# Patient Record
Sex: Female | Born: 1935 | Race: White | Hispanic: No | Smoking: Former smoker
Health system: Southern US, Community
[De-identification: ages and names within clinical notes are randomized; demographics above are authoritative.]

## PROBLEM LIST (undated history)

## (undated) DIAGNOSIS — I639 Cerebral infarction, unspecified: Secondary | ICD-10-CM

## (undated) DIAGNOSIS — I4891 Unspecified atrial fibrillation: Secondary | ICD-10-CM

## (undated) DIAGNOSIS — E079 Disorder of thyroid, unspecified: Secondary | ICD-10-CM

## (undated) DIAGNOSIS — M1990 Unspecified osteoarthritis, unspecified site: Secondary | ICD-10-CM

## (undated) DIAGNOSIS — F0390 Unspecified dementia without behavioral disturbance: Secondary | ICD-10-CM

## (undated) DIAGNOSIS — I10 Essential (primary) hypertension: Secondary | ICD-10-CM

## (undated) HISTORY — PX: ROTATOR CUFF REPAIR: SHX139

## (undated) HISTORY — PX: ABDOMINAL HYSTERECTOMY: SHX81

## (undated) HISTORY — PX: TONSILLECTOMY: SUR1361

## (undated) HISTORY — PX: BACK SURGERY: SHX140

## (undated) HISTORY — PX: APPENDECTOMY: SHX54

---

## 2016-10-04 ENCOUNTER — Emergency Department (HOSPITAL_COMMUNITY)
Admission: EM | Admit: 2016-10-04 | Discharge: 2016-10-04 | Disposition: A | Discharge status: Home / Self Care | Payer: Medicare Other | Attending: Emergency Medicine | Admitting: Emergency Medicine

## 2016-10-04 ENCOUNTER — Emergency Department (HOSPITAL_COMMUNITY): Payer: Medicare Other

## 2016-10-04 ENCOUNTER — Encounter (HOSPITAL_COMMUNITY): Payer: Self-pay | Admitting: Emergency Medicine

## 2016-10-04 DIAGNOSIS — F0390 Unspecified dementia without behavioral disturbance: Secondary | ICD-10-CM | POA: Diagnosis not present

## 2016-10-04 DIAGNOSIS — Z87891 Personal history of nicotine dependence: Secondary | ICD-10-CM | POA: Insufficient documentation

## 2016-10-04 DIAGNOSIS — I10 Essential (primary) hypertension: Secondary | ICD-10-CM | POA: Diagnosis not present

## 2016-10-04 DIAGNOSIS — M25511 Pain in right shoulder: Secondary | ICD-10-CM

## 2016-10-04 HISTORY — DX: Essential (primary) hypertension: I10

## 2016-10-04 HISTORY — DX: Cerebral infarction, unspecified: I63.9

## 2016-10-04 HISTORY — DX: Unspecified atrial fibrillation: I48.91

## 2016-10-04 HISTORY — DX: Disorder of thyroid, unspecified: E07.9

## 2016-10-04 HISTORY — DX: Unspecified dementia, unspecified severity, without behavioral disturbance, psychotic disturbance, mood disturbance, and anxiety: F03.90

## 2016-10-04 MED ORDER — HYDROCODONE-ACETAMINOPHEN 5-325 MG PO TABS
1.0000 | ORAL_TABLET | Freq: Once | ORAL | Status: AC
  Administered 2016-10-04: 1 via ORAL
  Filled 2016-10-04: qty 1

## 2016-10-04 MED ORDER — HYDROCODONE-ACETAMINOPHEN 5-325 MG PO TABS: 1.0000 | ORAL_TABLET | ORAL | 0 refills | Status: DC | PRN

## 2016-10-04 NOTE — ED Provider Notes (Signed)
[facility]-EMERGENCY DEPT Provider Note   CSN: [id number] Arrival date & time: 10/04/16  1708     History   Chief Complaint Chief Complaint  Patient presents with  . Shoulder Pain    HPI Diane Simon is a 83 y.o. female.  HPI Patient with chronic right shoulder pain for the past 4 years. States that the last week and a half pain is worsen. She's had bruising which is extended down the arm. Denies any known injury. Has been referred to an orthopedist by her primary physician. Says she's taking Ultram but pain has not improved. Denies any fever or chills. No numbness or weakness. Past Medical History:  Diagnosis Date  . Atrial fibrillation (HCC)   . Dementia   . Hypertension   . Stroke (HCC)   . Thyroid disease     There are no active problems to display for this patient.   Past Surgical History:  Procedure Laterality Date  . ABDOMINAL HYSTERECTOMY    . APPENDECTOMY    . BACK SURGERY    . ROTATOR CUFF REPAIR Right   . TONSILLECTOMY      OB History    No data available       Home Medications    Prior to Admission medications   Medication Sig Start Date End Date Taking? Authorizing Provider  HYDROcodone-acetaminophen (NORCO) 5-325 MG tablet Take 1-2 tablets by mouth every 4 (four) hours as needed. 10/04/16   Loren RacerYelverton, Evolet Salminen, MD    Family History No family history on file.  Social History Social History  Substance Use Topics  . Smoking status: Former Games developermoker  . Smokeless tobacco: Never Used  . Alcohol use No     Allergies   Ciprofloxacin; Latex; and Penicillins   Review of Systems Review of Systems  Constitutional: Negative for chills and fever.  Respiratory: Negative for cough and shortness of breath.   Cardiovascular: Negative for chest pain and leg swelling.  Gastrointestinal: Negative for abdominal pain, diarrhea, nausea and vomiting.  Musculoskeletal: Positive for arthralgias and myalgias. Negative for back pain and neck pain.  Skin: Positive  for color change.  Neurological: Negative for weakness and numbness.  All other systems reviewed and are negative.    Physical Exam Updated Vital Signs There were no vitals taken for this visit.  Physical Exam  Constitutional: She is oriented to person, place, and time. She appears well-developed and well-nourished.  HENT:  Head: Normocephalic and atraumatic.  Mouth/Throat: Oropharynx is clear and moist.  Eyes: Pupils are equal, round, and reactive to light. EOM are normal.  Neck: Normal range of motion. Neck supple.  No posterior midline cervical tenderness to palpation.  Cardiovascular: Normal rate and regular rhythm.   Pulmonary/Chest: Effort normal and breath sounds normal.  Abdominal: Soft. Bowel sounds are normal. There is no tenderness. There is no rebound and no guarding.  Musculoskeletal: She exhibits tenderness. She exhibits no edema or deformity.  Patient with diffuse bruising to the right shoulder extending down to the mid humerus. She has diffuse tenderness to palpation and decreased range of motion especially with abduction. No obvious deformity. No warmth or erythema. Distal pulses are 2+. Full range of motion of the right elbow and wrist.  Neurological: She is alert and oriented to person, place, and time.  Strength equal bilaterally. Sensation intact.  Skin: Skin is warm and dry. No rash noted. No erythema.  Psychiatric: She has a normal mood and affect. Her behavior is normal.  Nursing note and vitals reviewed.  ED Treatments / Results  Labs (all labs ordered are listed, but only abnormal results are displayed) Labs Reviewed - No data to display  EKG  EKG Interpretation None       Radiology Dg Shoulder Right  Result Date: 10/04/2016 CLINICAL DATA:  Severe right shoulder pain with bruising EXAM: RIGHT SHOULDER - 2+ VIEW COMPARISON:  None. FINDINGS: AC joint degenerative changes. Postsurgical changes at the right humeral head. No acute fracture or  dislocation. Moderate inferior glenohumeral degenerative change. Soft tissue swelling over the shoulder. Nonspecific soft tissue calcification adjacent to the acromion. Old right rib fractures. IMPRESSION: 1. No acute osseous abnormality 2. Degenerative changes of the glenohumeral joint and the AC joint. Electronically Signed   By: Jasmine PangKim  Fujinaga M.D.   On: 10/04/2016 18:31    Procedures Procedures (including critical care time)  Medications Ordered in ED Medications  HYDROcodone-acetaminophen (NORCO/VICODIN) 5-325 MG per tablet 1 tablet (1 tablet Oral Given 10/04/16 2009)     Initial Impression / Assessment and Plan / ED Course  I have reviewed the triage vital signs and the nursing notes.  Pertinent labs & imaging results that were available during my care of the patient were reviewed by me and considered in my medical decision making (see chart for details).    X-ray without acute abnormality. Patient does have degenerative changes and may have rotator cuff injury. Placed in sling for comfort. Advised ice and elevation. We'll give referral to orthopedics.   Final Clinical Impressions(s) / ED Diagnoses   Final diagnoses:  Right shoulder pain, unspecified chronicity    New Prescriptions New Prescriptions   HYDROCODONE-ACETAMINOPHEN (NORCO) 5-325 MG TABLET    Take 1-2 tablets by mouth every 4 (four) hours as needed.     Loren RacerYelverton, Rosealee Recinos, MD 10/04/16 2032

## 2016-10-04 NOTE — ED Triage Notes (Signed)
Pt c/o chronic right shoulder pain since since surgery x 4 years ago. Worsening pain x 1.5 weeks. Pt was seen at [facility] and given pain medication. Pt has large bruise to left upper arm that has spread over the past 1.5 weeks. Pt is on Eliquis. Pt denies injury. nad noted.

## 2016-10-22 ENCOUNTER — Other Ambulatory Visit: Payer: Self-pay | Admitting: Orthopedic Surgery

## 2016-10-22 DIAGNOSIS — M25511 Pain in right shoulder: Principal | ICD-10-CM

## 2016-10-22 DIAGNOSIS — G8929 Other chronic pain: Secondary | ICD-10-CM

## 2016-10-25 ENCOUNTER — Other Ambulatory Visit: Payer: Self-pay | Admitting: Orthopedic Surgery

## 2016-10-25 DIAGNOSIS — G8929 Other chronic pain: Secondary | ICD-10-CM

## 2016-10-25 DIAGNOSIS — M25511 Pain in right shoulder: Principal | ICD-10-CM

## 2016-10-26 ENCOUNTER — Ambulatory Visit
Admission: RE | Admit: 2016-10-26 | Discharge: 2016-10-26 | Disposition: A | Discharge status: Home / Self Care | Payer: Medicare Other | Source: Ambulatory Visit | Attending: Orthopedic Surgery | Admitting: Orthopedic Surgery

## 2016-10-26 DIAGNOSIS — M25511 Pain in right shoulder: Principal | ICD-10-CM

## 2016-10-26 DIAGNOSIS — G8929 Other chronic pain: Secondary | ICD-10-CM

## 2016-10-26 LAB — SYNOVIAL CELL COUNT + DIFF, W/ CRYSTALS
BASOPHILS, %: 0 %
EOSINOPHILS-SYNOVIAL: 2 % (ref 0–2)
Lymphocytes-Synovial Fld: 33 % (ref 0–74)
Monocyte/Macrophage: 26 % (ref 0–69)
Neutrophil, Synovial: 38 % — ABNORMAL HIGH (ref 0–24)
SYNOVIOCYTES, %: 1 % (ref 0–15)
WBC, Synovial: 1658 cells/uL — ABNORMAL HIGH (ref <150)

## 2016-10-30 LAB — BODY FLUID CULTURE
GRAM STAIN: NONE SEEN
Gram Stain: 336
ORGANISM ID, BACTERIA: NO GROWTH

## 2016-10-31 LAB — ANAEROBIC CULTURE: Gram Stain: NONE SEEN

## 2017-03-26 NOTE — Pre-Procedure Instructions (Addendum)
Diane Simon  03/26/2017      [facility] [place] - [address] [place] [id number] Phone: [phone] Fax: [phone]    Your procedure is scheduled on  Friday 03/29/17  Report [facility] Admitting at 1030 A.M.  Call this number if you have problems the morning of surgery:  [phone]   Remember:  Do not eat food or drink liquids after midnight.  Take these medicines the morning of surgery with A SIP OF WATER - ATENOLOL (TENORMIN), LEVOTHYROXINE, RIVASTIGMINE (EXELON)  7 days prior to surgery STOP taking any Aspirin(unless otherwise instructed by your surgeon), Aleve, Naproxen, Ibuprofen, Motrin, Advil, Goody's, BC's, all herbal medications, fish oil, and all vitamins   Do not wear jewelry, make-up or nail polish.  Do not wear lotions, powders, or perfumes, or deodorant.  Do not shave 48 hours prior to surgery.  Men may shave face and neck.  Do not bring valuables to the hospital.  [facility] is not responsible for any belongings or valuables.  Contacts, dentures or bridgework may not be worn into surgery.  Leave your suitcase in the car.  After surgery it may be brought to your room.  For patients admitted to the hospital, discharge time will be determined by your treatment team.  Patients discharged the day of surgery will not be allowed to drive home.   Name and phone number of your driver:    Special instructions:  [place] - Preparing for Surgery  Before surgery, you can play an important role.  Because skin is not sterile, your skin needs to be as free of germs as possible.  You can reduce the number of germs on you skin by washing with CHG (chlorahexidine gluconate) soap before surgery.  CHG is an antiseptic cleaner which kills germs and bonds with the skin to continue killing germs even after washing.  Please DO NOT use if you have an allergy to CHG or antibacterial soaps.  If your skin becomes reddened/irritated  stop using the CHG and inform your nurse when you arrive at Short Stay.  Do not shave (including legs and underarms) for at least 48 hours prior to the first CHG shower.  You may shave your face.  Please follow these instructions carefully:   1.  Shower with CHG Soap the night before surgery and the                                morning of Surgery.  2.  If you choose to wash your hair, wash your hair first as usual with your       normal shampoo.  3.  After you shampoo, rinse your hair and body thoroughly to remove the                      Shampoo.  4.  Use CHG as you would any other liquid soap.  You can apply chg directly       to the skin and wash gently with scrungie or a clean washcloth.  5.  Apply the CHG Soap to your body ONLY FROM THE NECK DOWN.        Do not use on open wounds or open sores.  Avoid contact with your eyes,       ears, mouth and genitals (private parts).  Wash genitals (private parts)  with your normal soap.  6.  Wash thoroughly, paying special attention to the area where your surgery        will be performed.  7.  Thoroughly rinse your body with warm water from the neck down.  8.  DO NOT shower/wash with your normal soap after using and rinsing off       the CHG Soap.  9.  Pat yourself dry with a clean towel.            10.  Wear clean pajamas.            11.  Place clean sheets on your bed the night of your first shower and do not        sleep with pets.  Day of Surgery  Do not apply any lotions/deoderants the morning of surgery.  Please wear clean clothes to the hospital/surgery center.    Please read over the following fact sheets that you were given. Total Joint Packet and MRSA Information

## 2017-03-27 ENCOUNTER — Encounter (HOSPITAL_COMMUNITY): Payer: Self-pay

## 2017-03-27 ENCOUNTER — Other Ambulatory Visit: Payer: Self-pay

## 2017-03-27 ENCOUNTER — Encounter (HOSPITAL_COMMUNITY)
Admission: RE | Admit: 2017-03-27 | Discharge: 2017-03-27 | Disposition: A | Discharge status: Home / Self Care | Payer: Medicare Other | Source: Ambulatory Visit | Attending: Orthopedic Surgery | Admitting: Orthopedic Surgery

## 2017-03-27 HISTORY — DX: Unspecified osteoarthritis, unspecified site: M19.90

## 2017-03-27 LAB — CBC
HCT: 41 % (ref 36.0–46.0)
HEMOGLOBIN: 13.6 g/dL (ref 12.0–15.0)
MCH: 30.2 pg (ref 26.0–34.0)
MCHC: 33.2 g/dL (ref 30.0–36.0)
MCV: 91.1 fL (ref 78.0–100.0)
Platelets: 199 10*3/uL (ref 150–400)
RBC: 4.5 MIL/uL (ref 3.87–5.11)
RDW: 12.9 % (ref 11.5–15.5)
WBC: 6.6 10*3/uL (ref 4.0–10.5)

## 2017-03-27 LAB — SURGICAL PCR SCREEN
MRSA, PCR: NEGATIVE
STAPHYLOCOCCUS AUREUS: NEGATIVE

## 2017-03-27 LAB — BASIC METABOLIC PANEL
ANION GAP: 10 (ref 5–15)
BUN: 8 mg/dL (ref 6–20)
CHLORIDE: 107 mmol/L (ref 101–111)
CO2: 25 mmol/L (ref 22–32)
CREATININE: 0.73 mg/dL (ref 0.44–1.00)
Calcium: 9.2 mg/dL (ref 8.9–10.3)
GFR calc non Af Amer: >60 mL/min (ref >60)
Glucose, Bld: 105 mg/dL — ABNORMAL HIGH (ref 65–99)
POTASSIUM: 3.6 mmol/L (ref 3.5–5.1)
SODIUM: 142 mmol/L (ref 135–145)

## 2017-03-27 LAB — PROTIME-INR
INR: 0.99
Prothrombin Time: 13 seconds (ref 11.4–15.2)

## 2017-03-27 NOTE — Progress Notes (Signed)
Patient and her daughter stated her last dose of eliquis was Sunday 03/24/17.  Patient was able to verbalize surgery being done and signed own consent.  Requested las EKG, cardiac clearance, any cardiac studies etc from patient's cardiologist DR. [place] in [place].

## 2017-03-28 MED ORDER — TRANEXAMIC ACID 1000 MG/10ML IV SOLN
1000.0000 mg | INTRAVENOUS | Status: AC
  Administered 2017-03-29: 1000 mg via INTRAVENOUS
  Filled 2017-03-28: qty 1100

## 2017-03-28 MED ORDER — CLINDAMYCIN PHOSPHATE 900 MG/50ML IV SOLN
900.0000 mg | INTRAVENOUS | Status: AC
  Administered 2017-03-29: 900 mg via INTRAVENOUS
  Filled 2017-03-28: qty 50

## 2017-03-28 NOTE — Progress Notes (Addendum)
Anesthesia Chart Review:  Pt is an 84 year old female scheduled for R reverse shoulder arthroplasty on 03/29/2017 with Victorino December, MD  - Pt reports her cardiologist is Dr. Corena Pilgrim at [facility]. Last office visit 03/18/17 with Tommie Sams, MD who cleared her for surgery.   PMH includes:  Atrial fibrillation, HTN, stroke, loop recorder, thyroid disease, dementia. Former smoker. BMI 21  Medications include: Eliquis, atenolol, levothyroxine, exelon. Last dose eliquis 03/24/17.   BP (!) 172/84 Comment: taken manually  Pulse 97   Temp 36.7 C   Resp 18   Ht 5' 7"  (1.702 m)   Wt 133 lb 4.8 oz (60.5 kg)   SpO2 99%   BMI 20.88 kg/m   Preoperative labs reviewed.    EKG 09/14/16 ([facility], [place], [place]):  - Sinus bradycardia (57 bpm).  Possible RV conduction delay.  Stress echo 01/27/14 ([facility], [place], [place]): 1.  Diagnostic criteria not met.  Ischemia cannot be assessed.  No widening of QRS interval during study.  No ischemic changes for heart rate obtained.    If no changes, I anticipate pt can proceed with surgery as scheduled.   Willeen Cass, FNP-BC [facility]/Anesthesiology Phone: [phone] 03/28/2017 10:25 AM

## 2017-03-29 ENCOUNTER — Inpatient Hospital Stay (HOSPITAL_COMMUNITY)
Admission: RE | Admit: 2017-03-29 | Discharge: 2017-03-30 | DRG: 483 | Disposition: A | Discharge status: Home / Self Care | Payer: Medicare Other | Source: Ambulatory Visit | Attending: Orthopedic Surgery | Admitting: Orthopedic Surgery

## 2017-03-29 ENCOUNTER — Inpatient Hospital Stay (HOSPITAL_COMMUNITY): Payer: Medicare Other | Admitting: Emergency Medicine

## 2017-03-29 ENCOUNTER — Encounter (HOSPITAL_COMMUNITY): Payer: Self-pay | Admitting: *Deleted

## 2017-03-29 ENCOUNTER — Encounter (HOSPITAL_COMMUNITY)
Admission: RE | Disposition: A | Discharge status: Home / Self Care | Payer: Self-pay | Source: Ambulatory Visit | Attending: Orthopedic Surgery

## 2017-03-29 ENCOUNTER — Inpatient Hospital Stay (HOSPITAL_COMMUNITY): Payer: Medicare Other

## 2017-03-29 DIAGNOSIS — M75101 Unspecified rotator cuff tear or rupture of right shoulder, not specified as traumatic: Secondary | ICD-10-CM | POA: Diagnosis present

## 2017-03-29 DIAGNOSIS — M19011 Primary osteoarthritis, right shoulder: Principal | ICD-10-CM | POA: Diagnosis present

## 2017-03-29 DIAGNOSIS — Z880 Allergy status to penicillin: Secondary | ICD-10-CM

## 2017-03-29 DIAGNOSIS — Z881 Allergy status to other antibiotic agents status: Secondary | ICD-10-CM

## 2017-03-29 DIAGNOSIS — Z91040 Latex allergy status: Secondary | ICD-10-CM

## 2017-03-29 DIAGNOSIS — Z8673 Personal history of transient ischemic attack (TIA), and cerebral infarction without residual deficits: Secondary | ICD-10-CM

## 2017-03-29 DIAGNOSIS — Z87891 Personal history of nicotine dependence: Secondary | ICD-10-CM | POA: Diagnosis not present

## 2017-03-29 DIAGNOSIS — I10 Essential (primary) hypertension: Secondary | ICD-10-CM | POA: Diagnosis present

## 2017-03-29 DIAGNOSIS — Z79899 Other long term (current) drug therapy: Secondary | ICD-10-CM

## 2017-03-29 DIAGNOSIS — Z96619 Presence of unspecified artificial shoulder joint: Secondary | ICD-10-CM

## 2017-03-29 DIAGNOSIS — M12811 Other specific arthropathies, not elsewhere classified, right shoulder: Secondary | ICD-10-CM

## 2017-03-29 DIAGNOSIS — E079 Disorder of thyroid, unspecified: Secondary | ICD-10-CM | POA: Diagnosis not present

## 2017-03-29 HISTORY — PX: REVERSE SHOULDER ARTHROPLASTY: SHX5054

## 2017-03-29 SURGERY — ARTHROPLASTY, SHOULDER, TOTAL, REVERSE
Anesthesia: General | Site: Shoulder | Laterality: Right

## 2017-03-29 MED ORDER — ONDANSETRON HCL 4 MG/2ML IJ SOLN
INTRAMUSCULAR | Status: DC | PRN
  Administered 2017-03-29: 4 mg via INTRAVENOUS

## 2017-03-29 MED ORDER — ONDANSETRON 4 MG PO TBDP: 4.0000 mg | ORAL_TABLET | Freq: Three times a day (TID) | ORAL | 0 refills | Status: AC | PRN

## 2017-03-29 MED ORDER — FENTANYL CITRATE (PF) 100 MCG/2ML IJ SOLN
INTRAMUSCULAR | Status: AC
  Filled 2017-03-29: qty 2

## 2017-03-29 MED ORDER — RIVASTIGMINE TARTRATE 1.5 MG PO CAPS
1.5000 mg | ORAL_CAPSULE | Freq: Two times a day (BID) | ORAL | Status: DC
  Administered 2017-03-29 – 2017-03-30 (×2): 1.5 mg via ORAL
  Filled 2017-03-29 (×2): qty 1

## 2017-03-29 MED ORDER — EPHEDRINE SULFATE-NACL 50-0.9 MG/10ML-% IV SOSY
PREFILLED_SYRINGE | INTRAVENOUS | Status: DC | PRN
  Administered 2017-03-29: 5 mg via INTRAVENOUS

## 2017-03-29 MED ORDER — 0.9 % SODIUM CHLORIDE (POUR BTL) OPTIME
TOPICAL | Status: DC | PRN
  Administered 2017-03-29: 1000 mL

## 2017-03-29 MED ORDER — ESMOLOL HCL 100 MG/10ML IV SOLN
INTRAVENOUS | Status: DC | PRN
  Administered 2017-03-29 (×2): 20 mg via INTRAVENOUS

## 2017-03-29 MED ORDER — ATENOLOL 50 MG PO TABS
25.0000 mg | ORAL_TABLET | Freq: Every day | ORAL | Status: DC
  Filled 2017-03-29: qty 1

## 2017-03-29 MED ORDER — BUPIVACAINE HCL (PF) 0.25 % IJ SOLN
INTRAMUSCULAR | Status: AC
  Filled 2017-03-29: qty 30

## 2017-03-29 MED ORDER — ALBUMIN HUMAN 5 % IV SOLN
INTRAVENOUS | Status: DC | PRN
  Administered 2017-03-29: 14:00:00 via INTRAVENOUS

## 2017-03-29 MED ORDER — ROPIVACAINE HCL 7.5 MG/ML IJ SOLN
INTRAMUSCULAR | Status: DC | PRN
  Administered 2017-03-29: 20 mL via PERINEURAL

## 2017-03-29 MED ORDER — OXYCODONE HCL 5 MG PO TABS: 5.0000 mg | ORAL_TABLET | ORAL | 0 refills | Status: AC | PRN

## 2017-03-29 MED ORDER — FENTANYL CITRATE (PF) 100 MCG/2ML IJ SOLN
25.0000 ug | INTRAMUSCULAR | Status: DC | PRN
  Administered 2017-03-29: 50 ug via INTRAVENOUS

## 2017-03-29 MED ORDER — BUPIVACAINE-EPINEPHRINE (PF) 0.5% -1:200000 IJ SOLN
INTRAMUSCULAR | Status: AC
  Filled 2017-03-29: qty 30

## 2017-03-29 MED ORDER — METHOCARBAMOL 500 MG PO TABS
500.0000 mg | ORAL_TABLET | Freq: Four times a day (QID) | ORAL | Status: DC | PRN
  Administered 2017-03-30: 500 mg via ORAL
  Filled 2017-03-29: qty 1

## 2017-03-29 MED ORDER — FENTANYL CITRATE (PF) 250 MCG/5ML IJ SOLN
INTRAMUSCULAR | Status: AC
  Filled 2017-03-29: qty 5

## 2017-03-29 MED ORDER — MIDAZOLAM HCL 2 MG/2ML IJ SOLN
INTRAMUSCULAR | Status: AC
  Filled 2017-03-29: qty 2

## 2017-03-29 MED ORDER — PROPOFOL 10 MG/ML IV BOLUS
INTRAVENOUS | Status: AC
Start: 2017-03-29 — End: ?
  Filled 2017-03-29: qty 20

## 2017-03-29 MED ORDER — APIXABAN 5 MG PO TABS
5.0000 mg | ORAL_TABLET | Freq: Two times a day (BID) | ORAL | Status: DC
  Administered 2017-03-30: 5 mg via ORAL
  Filled 2017-03-29: qty 1

## 2017-03-29 MED ORDER — ACETAMINOPHEN 500 MG PO TABS
1000.0000 mg | ORAL_TABLET | Freq: Four times a day (QID) | ORAL | Status: DC
  Administered 2017-03-29 – 2017-03-30 (×3): 1000 mg via ORAL
  Filled 2017-03-29 (×3): qty 2

## 2017-03-29 MED ORDER — CHLORHEXIDINE GLUCONATE 4 % EX LIQD: 60.0000 mL | Freq: Once | CUTANEOUS | Status: DC

## 2017-03-29 MED ORDER — PHENYLEPHRINE HCL 10 MG/ML IJ SOLN
INTRAVENOUS | Status: DC | PRN
  Administered 2017-03-29: 25 ug/min via INTRAVENOUS

## 2017-03-29 MED ORDER — PHENYLEPHRINE 40 MCG/ML (10ML) SYRINGE FOR IV PUSH (FOR BLOOD PRESSURE SUPPORT)
PREFILLED_SYRINGE | INTRAVENOUS | Status: DC | PRN
  Administered 2017-03-29 (×2): 80 ug via INTRAVENOUS

## 2017-03-29 MED ORDER — PROPOFOL 10 MG/ML IV BOLUS
INTRAVENOUS | Status: DC | PRN
  Administered 2017-03-29: 120 mg via INTRAVENOUS

## 2017-03-29 MED ORDER — LACTATED RINGERS IV SOLN: Freq: Once | INTRAVENOUS | Status: DC

## 2017-03-29 MED ORDER — EPHEDRINE 5 MG/ML INJ
INTRAVENOUS | Status: AC
  Filled 2017-03-29: qty 10

## 2017-03-29 MED ORDER — ONDANSETRON HCL 4 MG PO TABS: 4.0000 mg | ORAL_TABLET | Freq: Four times a day (QID) | ORAL | Status: DC | PRN

## 2017-03-29 MED ORDER — METHOCARBAMOL 1000 MG/10ML IJ SOLN
500.0000 mg | Freq: Four times a day (QID) | INTRAVENOUS | Status: DC | PRN
  Filled 2017-03-29: qty 5

## 2017-03-29 MED ORDER — PHENYLEPHRINE 40 MCG/ML (10ML) SYRINGE FOR IV PUSH (FOR BLOOD PRESSURE SUPPORT)
PREFILLED_SYRINGE | INTRAVENOUS | Status: AC
  Filled 2017-03-29: qty 10

## 2017-03-29 MED ORDER — METOPROLOL TARTRATE 5 MG/5ML IV SOLN
INTRAVENOUS | Status: DC | PRN
  Administered 2017-03-29 (×3): 1 mg via INTRAVENOUS

## 2017-03-29 MED ORDER — SENNA 8.6 MG PO TABS
1.0000 | ORAL_TABLET | Freq: Two times a day (BID) | ORAL | Status: DC
  Administered 2017-03-29 – 2017-03-30 (×2): 8.6 mg via ORAL
  Filled 2017-03-29 (×2): qty 1

## 2017-03-29 MED ORDER — LACTATED RINGERS IV SOLN
INTRAVENOUS | Status: DC | PRN
  Administered 2017-03-29 (×2): via INTRAVENOUS

## 2017-03-29 MED ORDER — FENTANYL CITRATE (PF) 100 MCG/2ML IJ SOLN
INTRAMUSCULAR | Status: AC
  Administered 2017-03-29: 50 ug via INTRAVENOUS
  Filled 2017-03-29: qty 2

## 2017-03-29 MED ORDER — METOCLOPRAMIDE HCL 5 MG/ML IJ SOLN: 10.0000 mg | Freq: Once | INTRAMUSCULAR | Status: DC | PRN

## 2017-03-29 MED ORDER — OXYCODONE HCL 5 MG PO TABS
5.0000 mg | ORAL_TABLET | ORAL | Status: DC | PRN
  Administered 2017-03-30: 5 mg via ORAL
  Filled 2017-03-29: qty 1

## 2017-03-29 MED ORDER — ESMOLOL HCL 100 MG/10ML IV SOLN
INTRAVENOUS | Status: AC
  Filled 2017-03-29: qty 10

## 2017-03-29 MED ORDER — MORPHINE SULFATE (PF) 2 MG/ML IV SOLN: 2.0000 mg | INTRAVENOUS | Status: DC | PRN

## 2017-03-29 MED ORDER — LEVOTHYROXINE SODIUM 75 MCG PO TABS
75.0000 ug | ORAL_TABLET | Freq: Every day | ORAL | Status: DC
  Administered 2017-03-30: 75 ug via ORAL
  Filled 2017-03-29: qty 1

## 2017-03-29 MED ORDER — LIDOCAINE 2% (20 MG/ML) 5 ML SYRINGE
INTRAMUSCULAR | Status: DC | PRN
  Administered 2017-03-29: 100 mg via INTRAVENOUS

## 2017-03-29 MED ORDER — ROCURONIUM BROMIDE 10 MG/ML (PF) SYRINGE
PREFILLED_SYRINGE | INTRAVENOUS | Status: DC | PRN
  Administered 2017-03-29: 50 mg via INTRAVENOUS

## 2017-03-29 MED ORDER — GLYCOPYRROLATE 0.2 MG/ML IJ SOLN
INTRAMUSCULAR | Status: DC | PRN
  Administered 2017-03-29: 0.2 mg via INTRAVENOUS

## 2017-03-29 MED ORDER — MEPERIDINE HCL 25 MG/ML IJ SOLN: 6.2500 mg | INTRAMUSCULAR | Status: DC | PRN

## 2017-03-29 MED ORDER — ONDANSETRON HCL 4 MG/2ML IJ SOLN: 4.0000 mg | Freq: Four times a day (QID) | INTRAMUSCULAR | Status: DC | PRN

## 2017-03-29 MED ORDER — FENTANYL CITRATE (PF) 100 MCG/2ML IJ SOLN
INTRAMUSCULAR | Status: DC | PRN
  Administered 2017-03-29 (×2): 50 ug via INTRAVENOUS

## 2017-03-29 MED ORDER — FENTANYL CITRATE (PF) 100 MCG/2ML IJ SOLN
50.0000 ug | Freq: Once | INTRAMUSCULAR | Status: AC
  Administered 2017-03-29: 50 ug via INTRAVENOUS

## 2017-03-29 MED ORDER — LIDOCAINE 2% (20 MG/ML) 5 ML SYRINGE
INTRAMUSCULAR | Status: AC
Start: 2017-03-29 — End: ?
  Filled 2017-03-29: qty 5

## 2017-03-29 SURGICAL SUPPLY — 56 items
BIT DRILL 5/64X5 DISP (BIT) IMPLANT
BIT DRILL TWIST 2.7 (BIT) ×2 IMPLANT
BIT DRILL TWIST 2.7MM (BIT) ×1
BLADE SAG 18X100X1.27 (BLADE) ×3 IMPLANT
CAPT SHLDR REVTOTAL 2 ×3 IMPLANT
CLOSURE WOUND 1/2 X4 (GAUZE/BANDAGES/DRESSINGS) ×1
COVER SURGICAL LIGHT HANDLE (MISCELLANEOUS) ×3 IMPLANT
DRAPE IMP U-DRAPE 54X76 (DRAPES) ×6 IMPLANT
DRAPE INCISE IOBAN 66X45 STRL (DRAPES) ×3 IMPLANT
DRAPE ORTHO SPLIT 77X108 STRL (DRAPES) ×4
DRAPE SURG ORHT 6 SPLT 77X108 (DRAPES) ×2 IMPLANT
DRAPE U-SHAPE 47X51 STRL (DRAPES) ×3 IMPLANT
DRSG AQUACEL AG ADV 3.5X10 (GAUZE/BANDAGES/DRESSINGS) ×3 IMPLANT
DURAPREP 26ML APPLICATOR (WOUND CARE) ×3 IMPLANT
ELECT BLADE 4.0 EZ CLEAN MEGAD (MISCELLANEOUS) ×3
ELECT REM PT RETURN 9FT ADLT (ELECTROSURGICAL) ×3
ELECTRODE BLDE 4.0 EZ CLN MEGD (MISCELLANEOUS) ×1 IMPLANT
ELECTRODE REM PT RTRN 9FT ADLT (ELECTROSURGICAL) ×1 IMPLANT
GLOVE BIO SURGEON STRL SZ7.5 (GLOVE) ×6 IMPLANT
GLOVE BIOGEL PI IND STRL 8 (GLOVE) ×1 IMPLANT
GLOVE BIOGEL PI INDICATOR 8 (GLOVE) ×2
GOWN STRL REUS W/ TWL LRG LVL3 (GOWN DISPOSABLE) ×2 IMPLANT
GOWN STRL REUS W/ TWL XL LVL3 (GOWN DISPOSABLE) ×2 IMPLANT
GOWN STRL REUS W/TWL LRG LVL3 (GOWN DISPOSABLE) ×4
GOWN STRL REUS W/TWL XL LVL3 (GOWN DISPOSABLE) ×4
KIT BASIN OR (CUSTOM PROCEDURE TRAY) ×3 IMPLANT
KIT BEACH CHAIR TRIMANO (MISCELLANEOUS) IMPLANT
KIT ROOM TURNOVER OR (KITS) ×3 IMPLANT
MANIFOLD NEPTUNE II (INSTRUMENTS) ×3 IMPLANT
NEEDLE 1/2 CIR MAYO (NEEDLE) IMPLANT
NEEDLE HYPO 25GX1X1/2 BEV (NEEDLE) ×3 IMPLANT
NS IRRIG 1000ML POUR BTL (IV SOLUTION) ×3 IMPLANT
PACK SHOULDER (CUSTOM PROCEDURE TRAY) ×3 IMPLANT
PAD ARMBOARD 7.5X6 YLW CONV (MISCELLANEOUS) ×6 IMPLANT
PIN THREADED REVERSE (PIN) ×3 IMPLANT
SLING ARM FOAM STRAP LRG (SOFTGOODS) IMPLANT
SLING ARM FOAM STRAP MED (SOFTGOODS) ×3 IMPLANT
SPONGE LAP 18X18 X RAY DECT (DISPOSABLE) ×3 IMPLANT
SPONGE LAP 4X18 X RAY DECT (DISPOSABLE) ×3 IMPLANT
STRIP CLOSURE SKIN 1/2X4 (GAUZE/BANDAGES/DRESSINGS) ×2 IMPLANT
SUCTION FRAZIER HANDLE 10FR (MISCELLANEOUS) ×2
SUCTION TUBE FRAZIER 10FR DISP (MISCELLANEOUS) ×1 IMPLANT
SUT FIBERWIRE #2 38 T-5 BLUE (SUTURE) ×3
SUT MAXBRAID (SUTURE) ×3 IMPLANT
SUT MNCRL AB 4-0 PS2 18 (SUTURE) ×3 IMPLANT
SUT VIC AB 1 CT1 27 (SUTURE)
SUT VIC AB 1 CT1 27XBRD ANBCTR (SUTURE) IMPLANT
SUT VIC AB 2-0 CT1 27 (SUTURE) ×4
SUT VIC AB 2-0 CT1 TAPERPNT 27 (SUTURE) ×2 IMPLANT
SUTURE FIBERWR #2 38 T-5 BLUE (SUTURE) ×1 IMPLANT
SYR CONTROL 10ML LL (SYRINGE) ×3 IMPLANT
TOWEL OR 17X24 6PK STRL BLUE (TOWEL DISPOSABLE) ×3 IMPLANT
TOWEL OR 17X26 10 PK STRL BLUE (TOWEL DISPOSABLE) ×3 IMPLANT
TOWER CARTRIDGE SMART MIX (DISPOSABLE) IMPLANT
WATER STERILE IRR 1000ML POUR (IV SOLUTION) IMPLANT
YANKAUER SUCT BULB TIP NO VENT (SUCTIONS) ×3 IMPLANT

## 2017-03-29 NOTE — Discharge Instructions (Signed)
Orthopedic discharge instructions:  -Maintain the sling to your right arm until your nerve block resolves.  After that point, wear the sling while sleeping and while out in public.  You may remove the sling to perform activity as tolerated with the right arm.  No heavy lifting however you are okay to lift a coffee cup or personal hygiene items. -Your postoperative bandage is water occlusive and should be left in place until your follow-up appointment with Dr. Aundria Rudogers.  You may shower with that bandage in place.  He should pat the bandage dry with a towel at the conclusion of your shower. -Take Tylenol and/or ibuprofen for mild to moderate pain.  For any breakthrough pain utilize oxycodone as directed.  -Apply ice to the right shoulder for 20-30 minutes at a time for each hour of the day.  -For prevention of blood clots resume your Eliquis as for before surgery. -Return to see Dr. Aundria Rudogers in 2-3 weeks for postoperative check.

## 2017-03-29 NOTE — Op Note (Signed)
03/29/2017  2:37 PM  PATIENT:  Diane Simon    PRE-OPERATIVE DIAGNOSIS:  Right shoulder rotator cuff arthropathy  POST-OPERATIVE DIAGNOSIS:  Same  PROCEDURE:  1. REVERSE SHOULDER ARTHROPLASTY 2.  Transfer of long head biceps to pectoralis major, right.  SURGEON:  Yolonda KidaJason Patrick Clem Wisenbaker, MD  ASSISTANT: Patrick Jupiterarla Bethune, RNFA  ANESTHESIA:   General  ESTIMATED BLOOD LOSS: 100 mL  PREOPERATIVE INDICATIONS:  Diane Muncheggy W Gunkel is a  84 y.o. female with a diagnosis of Right shoulder rotator cuff arthropathy who failed conservative measures and elected for surgical management.    The risks benefits and alternatives were discussed with the patient preoperatively including but not limited to the risks of infection, bleeding, nerve injury, cardiopulmonary complications, the need for revision surgery, dislocation, brachial plexus palsy, incomplete relief of pain, among others, and the patient was willing to proceed.  OPERATIVE IMPLANTS: Biomet size 13 mini humeral stem press-fit standard with a 44 mm reverse shoulder arthroplasty tray with a standard liner and a 36 + 3 mm glenosphere with a mini baseplate and 4 locking screws and one central nonlocking screw.  OPERATIVE FINDINGS: The teres minor and subscapularis tendons were intact.  The supraspinatus and infraspinatus were completely avulsed from the greater tuberosity.  Both metal anchors were able to be excised from the proximal humerus prior to implanting the humeral stem.  OPERATIVE PROCEDURE: The patient was brought to the operating room and placed in the supine position. General anesthesia was administered. IV antibiotics were given. A Foley was placed. Time out was performed. The upper extremity was prepped and draped in usual sterile fashion. The patient was in a beachchair position. Deltopectoral approach was carried out, the cephalic vein was mobilized from the deltoid perforators and retracted medially. The long head biceps tendon was  transferred to the pectoralis tendon with #2 Fiberwire. The subscapularis was released off of the bone.  I then performed circumferential releases of the humerus, and then dislocated the head,I then applied the jig, and cut the humeral head in 30 of retroversion,  and then reamed with the reamer to the above named size.   We next turned our attention to the glenoid.  Deep retractors were placed, and I resected the biceps stump and labrum circumferentially, and then placed a guidepin into the center position on the glenoid, with slight inferior inclination. I then reamed over the guidepin, and this created a small metaphyseal cancellus blush inferiorly, removing just the cartilage to the subchondral bone superiorly. The base plate was selected and impacted place, and then I secured it centrally with a nonlocking screw, and I had excellent purchase both inferiorly and superiorly. I placed a short locking screws on anterior and posterior aspects.  I then turned my attention to the glenosphere, and impacted this into place, placing slight inferior offset.  The glenoid sphere was completely seated, and had engagement of the [facility] taper. I then turned my attention back to the humerus.  I sequentially broached, and then trialed, and was found to restore soft tissue tension, and it had 2 finger tightness. Therefore the above named components were selected. The shoulder felt stable throughout functional motion.   I then impacted the real prosthesis into place, as well as the real humeral tray, and reduced the shoulder. The shoulder had excellent motion, and was stable, and I irrigated the wounds copiously.    I did not repair the subscapularis tendon.  I then irrigated the shoulder copiously once more, repaired the deltopectoral interval with #  2 FiberWire followed by subcutaneous Vicryl, subcuticular 3-0 Monocryl for skin, with Steri-Strips and sterile gauze for the skin. The patient was awakened and  returned back in stable and satisfactory condition. There no complications and She tolerated the procedure well.   Disposition: MAKENLY LARABEE was transferred to PACU in stable condition.  She will be admitted to the inpatient service for postoperative pain management and rehab.  She will be in her sling until the interscalene block has resolved.  She will resume Eliquis tomorrow.

## 2017-03-29 NOTE — H&P (Signed)
ORTHOPAEDIC H and P  REQUESTING PHYSICIAN: Yolonda Kidaogers, Kolbie Clarkston Patrick, MD  PCP:  Zachery DauerMilam, James T, MD  Chief Complaint: Right shoulder OA  HPI: Diane Simon is a 84 y.o. female who complains of recalcitrant right shoulder pain to injections, oral medications, and physical therapy.  She has rotator cuff arthropathy of the right shoulder.  We have been working her up over the last few months and preparing for reverse shoulder arthroplasty.  She presents today for that surgery.  She denies any new complaints.  Denies fevers, night sweats or chills.  Past Medical History:  Diagnosis Date  . Arthritis   . Atrial fibrillation (HCC)   . Dementia   . Hypertension   . Stroke [facility](HCC)    yrs ago  no residual  . Thyroid disease    Past Surgical History:  Procedure Laterality Date  . ABDOMINAL HYSTERECTOMY    . APPENDECTOMY    . BACK SURGERY     x5   . ROTATOR CUFF REPAIR Bilateral   . TONSILLECTOMY     Social History   Socioeconomic History  . Marital status: Widowed    Spouse name: None  . Number of children: None  . Years of education: None  . Highest education level: None  Social Needs  . Financial resource strain: None  . Food insecurity - worry: None  . Food insecurity - inability: None  . Transportation needs - medical: None  . Transportation needs - non-medical: None  Occupational History  . None  Tobacco Use  . Smoking status: Former Games developermoker  . Smokeless tobacco: Never Used  Substance and Sexual Activity  . Alcohol use: No  . Drug use: No  . Sexual activity: None  Other Topics Concern  . None  Social History Narrative  . None   History reviewed. No pertinent family history. Allergies  Allergen Reactions  . Ciprofloxacin Other (See Comments)    UNSPECIFIED REACTION   . Penicillins Other (See Comments)    UNSPECIFIED REACTION(s) Has patient had a PCN reaction causing immediate rash, facial/tongue/throat swelling, SOB or lightheadedness with hypotension:  Unknown Has patient had a PCN reaction causing severe rash involving mucus membranes or skin necrosis: Unknown Has patient had a PCN reaction that required hospitalization: Unknown Has patient had a PCN reaction occurring within the last 10 years: No If all of the above answers are "NO", then may proceed with Cephalosporin use.   . Latex Other (See Comments)    Blisters    Prior to Admission medications   Medication Sig Start Date End Date Taking? Authorizing Provider  apixaban (ELIQUIS) 5 MG TABS tablet Take 5 mg by mouth 2 (two) times daily.   Yes [provider]  atenolol (TENORMIN) 25 MG tablet Take 25 mg by mouth daily.   Yes [provider]  Diphenhydramine-APAP, sleep, (TYLENOL PM EXTRA STRENGTH PO) Take 1 tablet by mouth at bedtime as needed (sleep).   Yes [provider]  levothyroxine (SYNTHROID, LEVOTHROID) 75 MCG tablet Take 75 mcg by mouth daily before breakfast.   Yes [provider]  rivastigmine (EXELON) 1.5 MG capsule Take 1.5 mg by mouth 2 (two) times daily.   Yes [provider]  denosumab (PROLIA) 60 MG/ML SOLN injection Inject 60 mg into the skin every 6 (six) months. Administer in upper arm, thigh, or abdomen Next is due 03-27-2017    [provider]  HYDROcodone-acetaminophen (NORCO) 5-325 MG tablet Take 1-2 tablets by mouth every 4 (four) hours  as needed. Patient not taking: Reported on 03/20/2017 10/04/16   Loren Racer, MD  NON FORMULARY Apply 1-2 Pump topically 3 (three) times daily as needed (leg pain). Compounded Cream:  Lidocaine 5%  Piroxicam 3% Ketoprofen 5%    [provider]   No results found.  Positive ROS: All other systems have been reviewed and were otherwise negative with the exception of those mentioned in the HPI and as above.  Physical Exam: General: Alert, no acute distress Cardiovascular: No pedal edema Respiratory: No cyanosis, no use of accessory musculature GI: No  organomegaly, abdomen is soft and non-tender Skin: No lesions in the area of chief complaint Neurologic: Sensation intact distally Psychiatric: Patient is competent for consent with normal mood and affect Lymphatic: No axillary or cervical lymphadenopathy    Assessment: Right shoulder rotator cuff arthropathy.  Plan: -Plan for reverse shoulder arthroplasty today.  We reviewed the risk, benefits, and indications of this procedure with her and her daughter.  Diane Simon did provide informed consent. -We will plan for postoperative admission to the inpatient setting.  We will assess her pain management as well as functional status and plan for discharge over the weekend. - The risks, benefits, and alternatives were discussed with the patient. There are risks associated with the surgery including, but not limited to, problems with anesthesia (death), infection, fracture of bones, loosening or failure of implants, malunion, nonunion, hematoma (blood accumulation) which may require surgical drainage, blood clots, pulmonary embolism, nerve injury (foot drop), and blood vessel injury. The patient understands these risks and elects to proceed.     Yolonda Kida, MD Cell [phone]    03/29/2017 11:23 AM

## 2017-03-29 NOTE — Transfer of Care (Signed)
Immediate Anesthesia Transfer of Care Note  Patient: Diane Simon  Procedure(s) Performed: REVERSE SHOULDER ARTHROPLASTY (Right Shoulder)  Patient Location: PACU  Anesthesia Type:General  Level of Consciousness: awake, alert , oriented and patient cooperative  Airway & Oxygen Therapy: Patient Spontanous Breathing and Patient connected to nasal cannula oxygen  Post-op Assessment: Report given to RN and Post -op Vital signs reviewed and stable  Post vital signs: Reviewed and stable  Last Vitals:  Vitals:   03/29/17 1145 03/29/17 1149  BP: (!) 173/83 (!) 149/69  Pulse: 61 (!) 58  Resp: 19 19  Temp:    SpO2: 100% 100%    Last Pain:  Vitals:   03/29/17 1149  TempSrc:   PainSc: 0-No pain         Complications: No apparent anesthesia complications

## 2017-03-29 NOTE — Brief Op Note (Signed)
03/29/2017  2:36 PM  PATIENT:  Cory MunchPeggy W Marinaccio  84 y.o. female  PRE-OPERATIVE DIAGNOSIS:  Right shoulder rotator cuff arthroplasty  POST-OPERATIVE DIAGNOSIS:  Right shoulder rotator cuff arthroplasty  PROCEDURE:  Procedure(s) with comments: REVERSE SHOULDER ARTHROPLASTY (Right) - 2.5 hrs  SURGEON:  Surgeon(s) and Role:    * Aundria Rudogers, Noah DelaineJason Patrick, MD - Primary  PHYSICIAN ASSISTANT:   ASSISTANTS: Patrick Jupiterarla Bethune, RNFA   ANESTHESIA:   regional and general  EBL:  100 mL   BLOOD ADMINISTERED:none  DRAINS: none   LOCAL MEDICATIONS USED:  NONE  SPECIMEN:  No Specimen  DISPOSITION OF SPECIMEN:  N/A  COUNTS:  YES  TOURNIQUET:  * No tourniquets in log *  DICTATION: .Note written in EPIC  PLAN OF CARE: Admit to inpatient   PATIENT DISPOSITION:  PACU - hemodynamically stable.   Delay start of Pharmacological VTE agent (>24hrs) due to surgical blood loss or risk of bleeding: no

## 2017-03-29 NOTE — Plan of Care (Signed)
  Progressing Education: Knowledge of General Education information will improve 03/29/2017 1820 - Progressing by Darreld Mcleanox, Ekaterini Capitano, RN Health Behavior/Discharge Planning: Ability to manage health-related needs will improve 03/29/2017 1820 - Progressing by Darreld Mcleanox, Mea Ozga, RN Clinical Measurements: Ability to maintain clinical measurements within normal limits will improve 03/29/2017 1820 - Progressing by Darreld Mcleanox, Rochester Serpe, RN Will remain free from infection 03/29/2017 1820 - Progressing by Darreld Mcleanox, Nuri Larmer, RN Diagnostic test results will improve 03/29/2017 1820 - Progressing by Darreld Mcleanox, Panfilo Ketchum, RN Respiratory complications will improve 03/29/2017 1820 - Progressing by Darreld Mcleanox, Prince Couey, RN Cardiovascular complication will be avoided 03/29/2017 1820 - Progressing by Darreld Mcleanox, Ibraham Levi, RN Activity: Risk for activity intolerance will decrease 03/29/2017 1820 - Progressing by Darreld Mcleanox, Dedee Liss, RN Nutrition: Adequate nutrition will be maintained 03/29/2017 1820 - Progressing by Darreld Mcleanox, Bee Marchiano, RN Coping: Level of anxiety will decrease 03/29/2017 1820 - Progressing by Darreld Mcleanox, Adira Limburg, RN Elimination: Will not experience complications related to bowel motility 03/29/2017 1820 - Progressing by Darreld Mcleanox, Coree Brame, RN Will not experience complications related to urinary retention 03/29/2017 1820 - Progressing by Darreld Mcleanox, Cinda Hara, RN Pain Managment: General experience of comfort will improve 03/29/2017 1820 - Progressing by Darreld Mcleanox, Aum Caggiano, RN Safety: Ability to remain free from injury will improve 03/29/2017 1820 - Progressing by Darreld Mcleanox, Illene Sweeting, RN Skin Integrity: Risk for impaired skin integrity will decrease 03/29/2017 1820 - Progressing by Darreld Mcleanox, Breven Guidroz, RN

## 2017-03-29 NOTE — Anesthesia Preprocedure Evaluation (Signed)
Anesthesia Evaluation  Patient identified by MRN, date of birth, ID band Patient awake    Reviewed: Allergy & Precautions, NPO status , Patient's Chart, lab work & pertinent test results  Airway Mallampati: II  TM Distance: >3 FB Neck ROM: Full    Dental no notable dental hx.    Pulmonary former smoker,    Pulmonary exam normal breath sounds clear to auscultation       Cardiovascular hypertension, Pt. on medications Normal cardiovascular exam+ dysrhythmias Atrial Fibrillation  Rhythm:Regular Rate:Normal     Neuro/Psych dementia CVA, No Residual Symptoms negative psych ROS   GI/Hepatic negative GI ROS, Neg liver ROS,   Endo/Other  negative endocrine ROS  Renal/GU negative Renal ROS  negative genitourinary   Musculoskeletal negative musculoskeletal ROS (+)   Abdominal   Peds negative pediatric ROS (+)  Hematology negative hematology ROS (+)   Anesthesia Other Findings   Reproductive/Obstetrics negative OB ROS                             Anesthesia Physical Anesthesia Plan  ASA: III  Anesthesia Plan: General   Post-op Pain Management: GA combined w/ Regional for post-op pain   Induction: Intravenous  PONV Risk Score and Plan: 3 and Ondansetron and Treatment may vary due to age or medical condition  Airway Management Planned: Oral ETT  Additional Equipment:   Intra-op Plan:   Post-operative Plan: Extubation in OR  Informed Consent: I have reviewed the patients History and Physical, chart, labs and discussed the procedure including the risks, benefits and alternatives for the proposed anesthesia with the patient or authorized representative who has indicated his/her understanding and acceptance.   Dental advisory given  Plan Discussed with: CRNA  Anesthesia Plan Comments:         Anesthesia Quick Evaluation

## 2017-03-29 NOTE — Anesthesia Procedure Notes (Signed)
Anesthesia Regional Block: Supraclavicular block   Pre-Anesthetic Checklist: ,, timeout performed, Correct Patient, Correct Site, Correct Laterality, Correct Procedure, Correct Position, site marked, Risks and benefits discussed,  Surgical consent,  Pre-op evaluation,  At surgeon's request and post-op pain management  Laterality: Right and Upper  Prep: Maximum Sterile Barrier Precautions used, chloraprep       Needles:  Injection technique: Single-shot  Needle Type: Echogenic Stimulator Needle     Needle Length: 10cm      Additional Needles:   Procedures:,,,, ultrasound used (permanent image in chart),,,,  Narrative:  Start time: 03/29/2017 11:42 AM End time: 03/29/2017 11:52 AM Injection made incrementally with aspirations every 5 mL.  Performed by: Personally  Anesthesiologist: Phillips Groutarignan, Shulamis Wenberg, MD  Additional Notes: Risks, benefits and alternative to block explained extensively.  Patient tolerated procedure well, without complications.

## 2017-03-29 NOTE — Anesthesia Procedure Notes (Signed)
Procedure Name: Intubation Date/Time: 03/29/2017 12:40 PM Performed by: Colin Benton, CRNA Pre-anesthesia Checklist: Patient identified, Emergency Drugs available, Suction available and Patient being monitored Patient Re-evaluated:Patient Re-evaluated prior to induction Oxygen Delivery Method: Circle system utilized Preoxygenation: Pre-oxygenation with 100% oxygen Induction Type: IV induction Ventilation: Mask ventilation without difficulty Laryngoscope Size: Mac and 3 Grade View: Grade I Tube type: Oral Tube size: 7.0 mm Number of attempts: 1 Airway Equipment and Method: Stylet Placement Confirmation: ETT inserted through vocal cords under direct vision,  positive ETCO2 and breath sounds checked- equal and bilateral Secured at: 21 cm Tube secured with: Tape Dental Injury: Teeth and Oropharynx as per pre-operative assessment

## 2017-03-30 LAB — BASIC METABOLIC PANEL
Anion gap: 9 (ref 5–15)
BUN: 5 mg/dL — AB (ref 6–20)
CO2: 25 mmol/L (ref 22–32)
Calcium: 8.2 mg/dL — ABNORMAL LOW (ref 8.9–10.3)
Chloride: 104 mmol/L (ref 101–111)
Creatinine, Ser: 0.79 mg/dL (ref 0.44–1.00)
GFR calc Af Amer: >60 mL/min (ref >60)
GLUCOSE: 126 mg/dL — AB (ref 65–99)
POTASSIUM: 3.5 mmol/L (ref 3.5–5.1)
SODIUM: 138 mmol/L (ref 135–145)

## 2017-03-30 LAB — CBC
HCT: 30.4 % — ABNORMAL LOW (ref 36.0–46.0)
Hemoglobin: 9.9 g/dL — ABNORMAL LOW (ref 12.0–15.0)
MCH: 29.9 pg (ref 26.0–34.0)
MCHC: 32.6 g/dL (ref 30.0–36.0)
MCV: 91.8 fL (ref 78.0–100.0)
PLATELETS: 131 10*3/uL — AB (ref 150–400)
RBC: 3.31 MIL/uL — AB (ref 3.87–5.11)
RDW: 12.9 % (ref 11.5–15.5)
WBC: 7.4 10*3/uL (ref 4.0–10.5)

## 2017-03-30 NOTE — Progress Notes (Signed)
Discharge instructions and prescriptions reviewed with patient and daughter, verbalized understanding. IV removed, catheter tip intact. No questions/concerns at this time. Transported via wheelchair to lobby for discharge.

## 2017-03-30 NOTE — Progress Notes (Signed)
Subjective: 1 Day Post-Op Procedure(s) (LRB): REVERSE SHOULDER ARTHROPLASTY (Right) Patient reports pain as mild.  Reports block has worn off. Denies CP, SOB, numbness, tingling, N/V. Pain improved overnight. Feels ready to go home today. No other c/o. Daughter at bedside.   Objective: Vital signs in last 24 hours: Temp:  [97.3 F (36.3 C)-98.4 F (36.9 C)] 98.1 F (36.7 C) (01/19 0754) Pulse Rate:  [55-98] 59 (01/19 0754) Resp:  [0-19] 16 (01/19 0754) BP: (112-173)/(49-91) 115/50 (01/19 0754) SpO2:  [89 %-100 %] 96 % (01/19 0754) Weight:  [59 kg (130 lb)] 59 kg (130 lb) (01/18 1049)  Intake/Output from previous day: 01/18 0701 - 01/19 0700 In: 1250 [I.V.:1000; IV Piggyback:250] Out: 100 [Blood:100] Intake/Output this shift: No intake/output data recorded.  Recent Labs    03/27/17 1047 03/30/17 0602  HGB 13.6 9.9*   Recent Labs    03/27/17 1047 03/30/17 0602  WBC 6.6 7.4  RBC 4.50 3.31*  HCT 41.0 30.4*  PLT 199 131*   Recent Labs    03/27/17 1047 03/30/17 0602  NA 142 138  K 3.6 3.5  CL 107 104  CO2 25 25  BUN 8 5*  CREATININE 0.73 0.79  GLUCOSE 105* 126*  CALCIUM 9.2 8.2*   Recent Labs    03/27/17 1047  INR 0.99    Neurologically intact ABD soft Neurovascular intact Sensation intact distally Intact pulses distally Dorsiflexion/Plantar flexion intact Incision: dressing C/D/I and no drainage No cellulitis present Compartment soft no sign of DVT  Assessment/Plan: 1 Day Post-Op Procedure(s) (LRB): REVERSE SHOULDER ARTHROPLASTY (Right) Advance diet Up with therapy D/C IV fluids  D/C home today Discussed D/C instructions  BISSELL, JACLYN M. 03/30/2017, 8:01 AM

## 2017-03-30 NOTE — Care Management Note (Signed)
Case Management Note  Patient Details  Name: Diane Simon MRN: [id number] Date of Birth: [birth year]  Subjective/Objective:               Patient with order to DC to home today. Chart reviewed. No Home Health or Equipment orders, no unacknowledged Case Management consults or medication needs identified at the time of this note. Plan for DC to home.  CM signing off. If new needs arise today prior to discharge, please call Lawerance SabalDebbie Ronal Maybury RN CM at [phone].     Action/Plan:   Expected Discharge Date:  03/30/17               Expected Discharge Plan:  Home/Self Care  In-House Referral:     Discharge planning Services  CM Consult  Post Acute Care Choice:    Choice offered to:     DME Arranged:    DME Agency:     HH Arranged:    HH Agency:     Status of Service:  Completed, signed off  If discussed at [employer] Length of Stay Meetings, dates discussed:    Additional Comments:  Lawerance SabalDebbie Belkis Norbeck, RN 03/30/2017, 8:30 AM

## 2017-03-30 NOTE — Discharge Summary (Signed)
Patient ID: Diane Simon MRN: [id number] DOB/AGE: [birth year] 84 y.o.  Admit date: 03/29/2017 Discharge date: 03/30/2017  Admission Diagnoses:  Principal Problem:   Rotator cuff tear arthropathy, right Active Problems:   S/p reverse total shoulder arthroplasty   Discharge Diagnoses:  Same  Past Medical History:  Diagnosis Date  . Arthritis   . Atrial fibrillation (HCC)   . Dementia   . Hypertension   . Stroke [facility])    yrs ago  no residual  . Thyroid disease     Surgeries: Procedure(s): REVERSE SHOULDER ARTHROPLASTY on 03/29/2017   Consultants:   Discharged Condition: Improved  Hospital Course: Diane Simon is an 84 y.o. female who was admitted 03/29/2017 for operative treatment ofRotator cuff tear arthropathy, right. Patient has severe unremitting pain that affects sleep, daily activities, and work/hobbies. After pre-op clearance the patient was taken to the operating room on 03/29/2017 and underwent  Procedure(s): REVERSE SHOULDER ARTHROPLASTY.    Patient was given perioperative antibiotics:  Anti-infectives (From admission, onward)   Start     Dose/Rate Route Frequency Ordered Stop   03/29/17 1130  clindamycin (CLEOCIN) IVPB 900 mg     900 mg 100 mL/hr over 30 Minutes Intravenous To [facility] 03/28/17 1115 03/29/17 1238       Patient was given sequential compression devices, early ambulation, and chemoprophylaxis to prevent DVT.  Patient benefited maximally from hospital stay and there were no complications.    Recent vital signs:  Patient Vitals for the past 24 hrs:  BP Temp Temp src Pulse Resp SpO2 Weight  03/30/17 0754 (!) 115/50 98.1 F (36.7 C) Oral (!) 59 16 96 % -  03/30/17 0508 (!) 112/49 98.4 F (36.9 C) Oral (!) 59 - 95 % -  03/29/17 1959 136/62 97.9 F (36.6 C) Oral 69 17 97 % -  03/29/17 1718 (!) 146/67 (!) 97.5 F (36.4 C) Oral 62 - 99 % -  03/29/17 1638 - 97.8 F (36.6 C) - - - - -  03/29/17 1637 (!) 162/78 - - 62 (!) 9 100 % -   03/29/17 1630 - - - 62 (!) 9 100 % -  03/29/17 1622 (!) 161/78 - - 60 (!) 9 99 % -  03/29/17 1615 - - - (!) 59 (!) 8 100 % -  03/29/17 1607 (!) 154/68 - - (!) 55 10 100 % -  03/29/17 1600 - - - (!) 56 (!) 0 100 % -  03/29/17 1552 138/67 - - 63 (!) 8 (!) 89 % -  03/29/17 1545 - - - 90 19 97 % -  03/29/17 1537 (!) 154/91 - - 82 11 96 % -  03/29/17 1530 - - - 85 (!) 8 96 % -  03/29/17 1522 (!) 145/87 - - 95 10 98 % -  03/29/17 1515 - - - 87 13 100 % -  03/29/17 1508 (!) 150/91 (!) 97.3 F (36.3 C) - 98 16 100 % -  03/29/17 1149 (!) 149/69 - - (!) 58 19 100 % -  03/29/17 1145 (!) 173/83 - - 61 19 100 % -  03/29/17 1140 (!) 169/74 - - (!) 59 14 100 % -  03/29/17 1121 (!) 170/68 - - (!) 56 - - -  03/29/17 1049 (!) 173/75 97.9 F (36.6 C) Oral (!) 56 18 99 % 59 kg (130 lb)     Recent laboratory studies:  Recent Labs    03/27/17 1047 03/30/17 0602  WBC 6.6 7.4  HGB 13.6 9.9*  HCT 41.0 30.4*  PLT 199 131*  NA 142 138  K 3.6 3.5  CL 107 104  CO2 25 25  BUN 8 5*  CREATININE 0.73 0.79  GLUCOSE 105* 126*  INR 0.99  --   CALCIUM 9.2 8.2*     Discharge Medications:   Allergies as of 03/30/2017      Reactions   Ciprofloxacin Other (See Comments)   UNSPECIFIED REACTION    Penicillins Other (See Comments)   UNSPECIFIED REACTION(s) Has patient had a PCN reaction causing immediate rash, facial/tongue/throat swelling, SOB or lightheadedness with hypotension: Unknown Has patient had a PCN reaction causing severe rash involving mucus membranes or skin necrosis: Unknown Has patient had a PCN reaction that required hospitalization: Unknown Has patient had a PCN reaction occurring within the last 10 years: No If all of the above answers are "NO", then may proceed with Cephalosporin use.   Latex Other (See Comments)   Blisters       Medication List    STOP taking these medications   HYDROcodone-acetaminophen 5-325 MG tablet Commonly known as:  NORCO     TAKE these medications    atenolol 25 MG tablet Commonly known as:  TENORMIN Take 25 mg by mouth daily.   denosumab 60 MG/ML Soln injection Commonly known as:  PROLIA Inject 60 mg into the skin every 6 (six) months. Administer in upper arm, thigh, or abdomen Next is due 03-27-2017   ELIQUIS 5 MG Tabs tablet Generic drug:  apixaban Take 5 mg by mouth 2 (two) times daily.   levothyroxine 75 MCG tablet Commonly known as:  SYNTHROID, LEVOTHROID Take 75 mcg by mouth daily before breakfast.   NON FORMULARY Apply 1-2 Pump topically 3 (three) times daily as needed (leg pain). Compounded Cream:  Lidocaine 5%  Piroxicam 3% Ketoprofen 5%   ondansetron 4 MG disintegrating tablet Commonly known as:  ZOFRAN ODT Take 1 tablet (4 mg total) by mouth every 8 (eight) hours as needed for nausea or vomiting.   oxyCODONE 5 MG immediate release tablet Commonly known as:  ROXICODONE Take 1-2 tablets (5-10 mg total) by mouth every 4 (four) hours as needed for moderate pain or severe pain.   rivastigmine 1.5 MG capsule Commonly known as:  EXELON Take 1.5 mg by mouth 2 (two) times daily.   TYLENOL PM EXTRA STRENGTH PO Take 1 tablet by mouth at bedtime as needed (sleep).       Diagnostic Studies: Dg Shoulder Right Port  Result Date: 03/29/2017 CLINICAL DATA:  Postop reverse shoulder arthroplasty. EXAM: PORTABLE RIGHT SHOULDER COMPARISON:  CT 10/26/2016.  Radiographs 10/04/2016. FINDINGS: Two views are submitted, demonstrating interval reverse shoulder arthroplasty. The hardware appears well positioned. No evidence of acute fracture or dislocation. Moderate acromioclavicular degenerative changes are present. There is an old right-sided rib fracture. IMPRESSION: No demonstrated complication following right shoulder reverse arthroplasty. Electronically Signed   By: Carey Bullocks M.D.   On: 03/29/2017 16:34    Disposition: 01-Home or Self Care  Discharge Instructions    Call MD / Call 911   Complete by:  As directed     If you experience chest pain or shortness of breath, CALL 911 and be transported to the hospital emergency room.  If you develope a fever above 101 F, pus (white drainage) or increased drainage or redness at the wound, or calf pain, call your surgeon's office.   Constipation Prevention   Complete by:  As directed  Drink plenty of fluids.  Prune juice may be helpful.  You may use a stool softener, such as Colace (over the counter) 100 mg twice a day.  Use MiraLax (over the counter) for constipation as needed.   Diet - low sodium heart healthy   Complete by:  As directed    Increase activity slowly as tolerated   Complete by:  As directed          Signed: Ambers Iyengar M. 03/30/2017, 8:03 AM

## 2017-04-01 ENCOUNTER — Encounter (HOSPITAL_COMMUNITY): Payer: Self-pay | Admitting: Orthopedic Surgery

## 2017-04-01 NOTE — Anesthesia Postprocedure Evaluation (Signed)
Anesthesia Post Note  Patient: Diane Simon  Procedure(s) Performed: REVERSE SHOULDER ARTHROPLASTY (Right Shoulder)     Patient location during evaluation: PACU Anesthesia Type: General Level of consciousness: sedated and patient cooperative Pain management: pain level controlled Vital Signs Assessment: post-procedure vital signs reviewed and stable Respiratory status: spontaneous breathing Cardiovascular status: stable Anesthetic complications: no    Last Vitals:  Vitals:   03/30/17 0754 03/30/17 1042  BP: (!) 115/50 (!) 110/50  Pulse: (!) 59 60  Resp: 16   Temp: 36.7 C   SpO2: 96%     Last Pain:  Vitals:   03/30/17 1035  TempSrc:   PainSc: 2    Pain Goal:                 Lewie LoronJohn Rema Lievanos

## 2018-10-14 IMAGING — CT CT BIOPSY
2 series · 10 of 14 positions shown, 12 images · non-contrast
Comparison: none

INDICATION: Right shoulder effusion.

[Series 2: soft tissue · axial · 0.51mm/px · z∈[-181,-45]mm · 5 of 104 slices shown]
[im 18/104  soft-tissue]
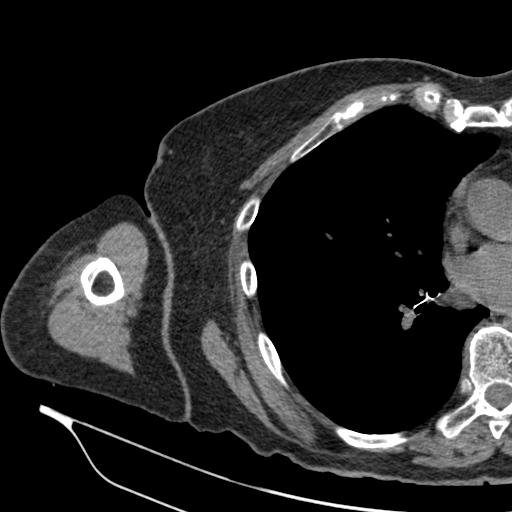
[im 35/104  soft-tissue]
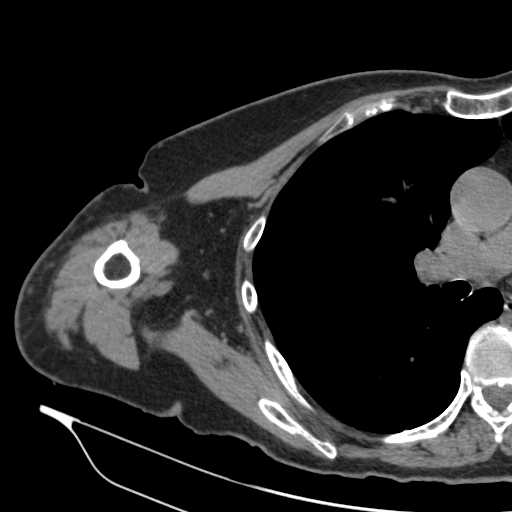
[im 52/104  soft-tissue]
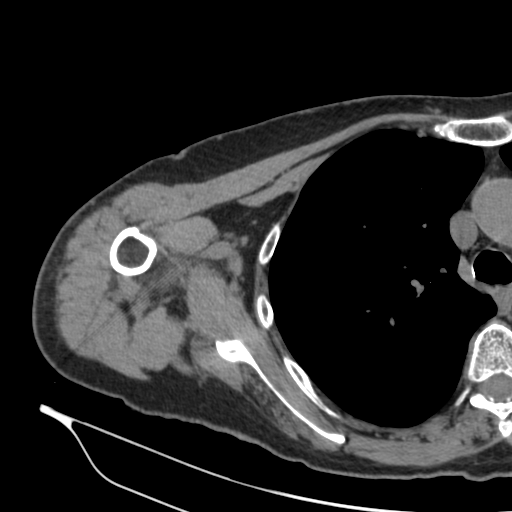
[im 69/104  soft-tissue]
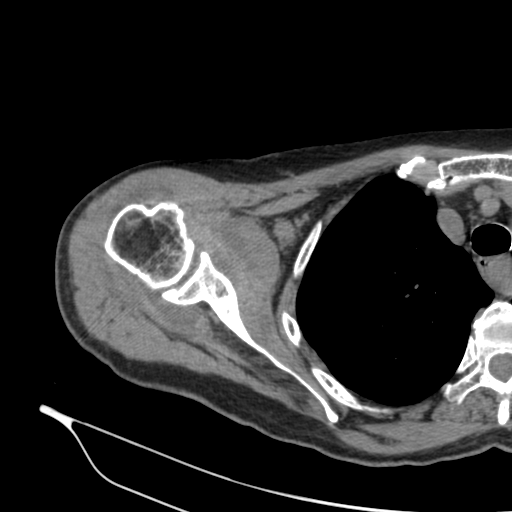
[im 86/104  soft-tissue]
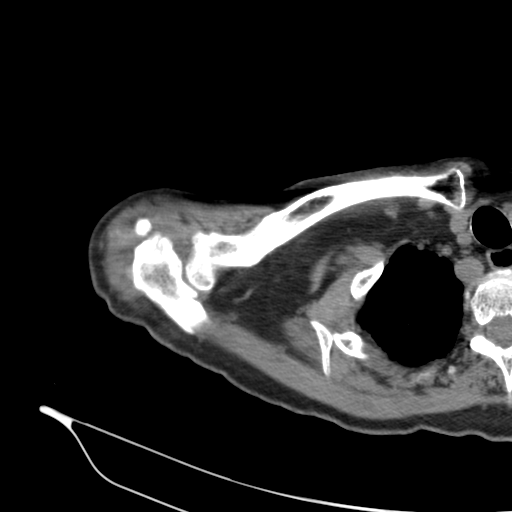

[Series 3: bone · axial · 0.51mm/px · z∈[-179,-43]mm · 5 of 102 slices shown, 7 images]
[im 17/102  soft-tissue]
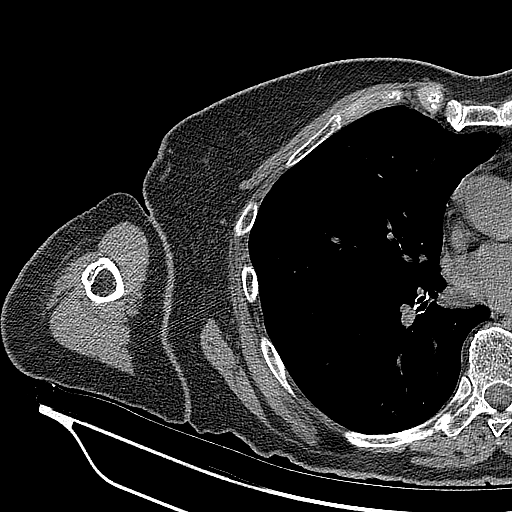
[im 17/102  bone]
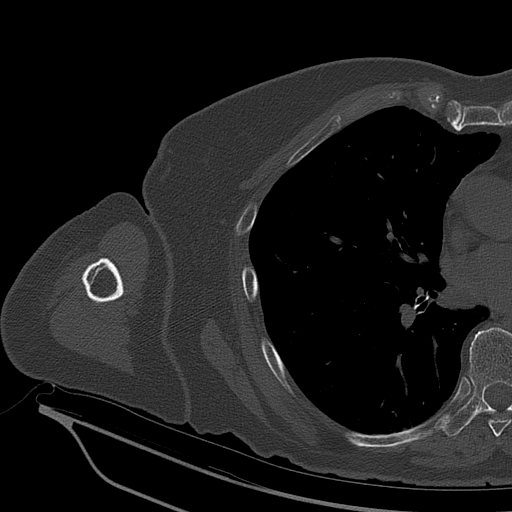
[im 34/102  bone]
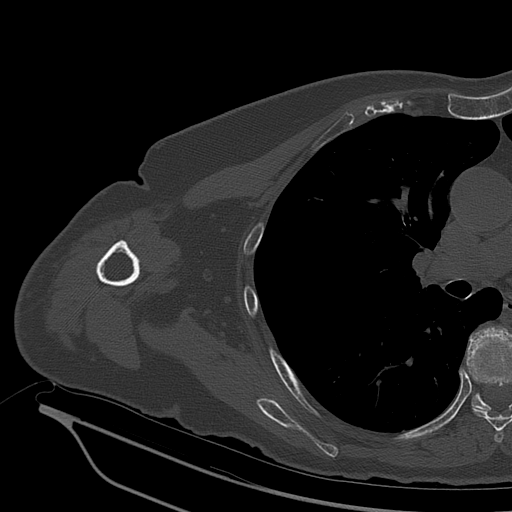
[im 51/102  bone]
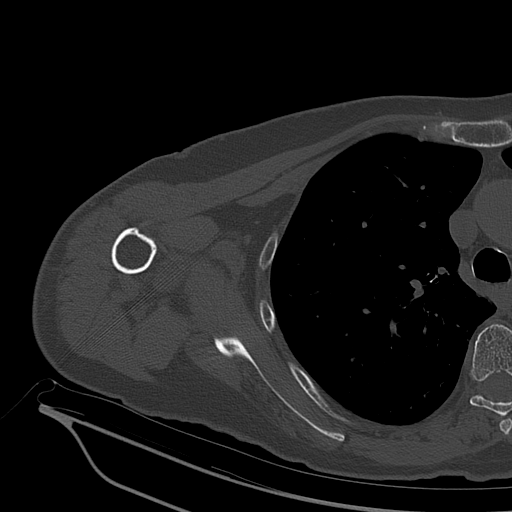
[im 68/102  bone]
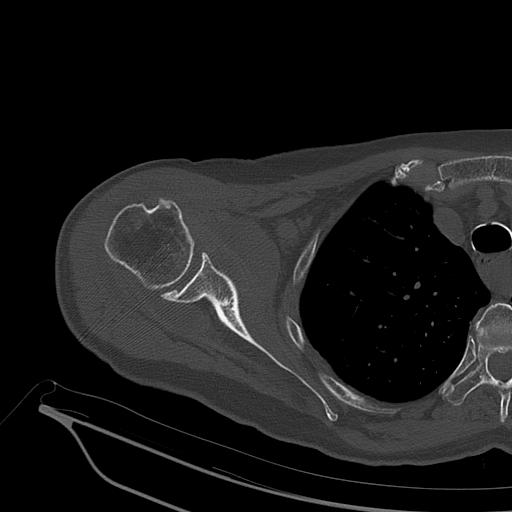
[im 85/102  soft-tissue]
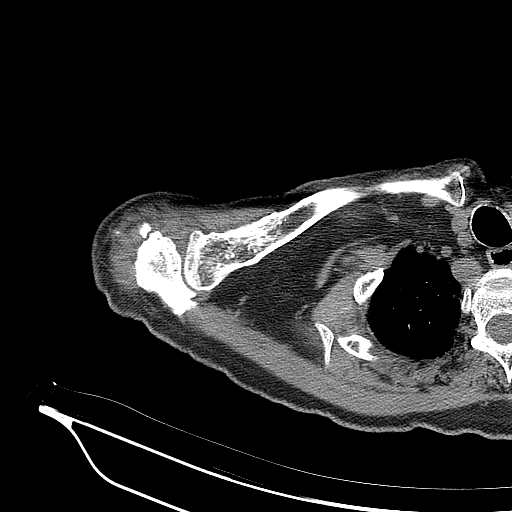
[im 85/102  bone]
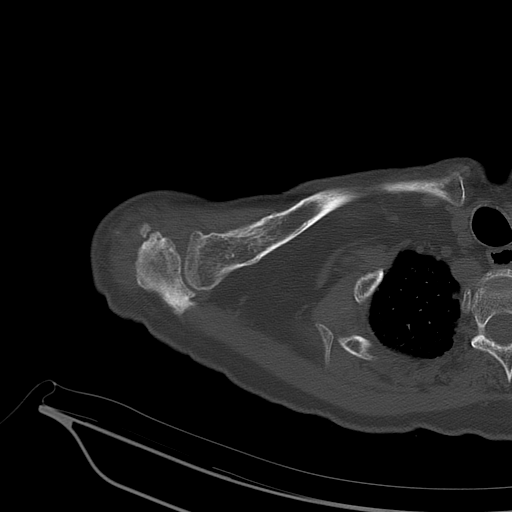

[10 of 14 positions shown; findings below may reference images not displayed]

EXAM:
CT-guided aspiration of the right shoulder effusion.

MEDICATIONS:
No medications were administered.

ANESTHESIA/SEDATION:
Local

COMPLICATIONS:
None immediate.

PROCEDURE:
Informed written consent was obtained from the patient after a
thorough discussion of the procedural risks, benefits and
alternatives. All questions were addressed. Sterile Barrier
Technique was utilized including mask, sterile gloves, sterile
drape, hand hygiene and skin antiseptic. A timeout was performed
prior to the initiation of the procedure.

A high-density collection is noted within the right shoulder joint.

Entry point was localized just below the glenohumeral joint
anteriorly. The superficial soft tissues were not status 1%
lidocaine. A 20 gauge needle was advanced into the collection under
direct fluoroscopic guidance.

6 mL of sanguinous material was aspirated from the collection.
Postprocedure imaging demonstrated no complication. The collection
is slightly decreased in size.
IMPRESSION: Technically successful right shoulder aspiration. Samples were sent
for analysis as requested. No complications.

## 2019-03-17 IMAGING — DX DG SHOULDER 2+V PORT*R*
2 series · 2 of 2 positions shown · non-contrast
Comparison: CT 10/26/2016.  Radiographs 10/04/2016.

CLINICAL DATA: Postop reverse shoulder arthroplasty.

EXAM:
PORTABLE RIGHT SHOULDER

[shoulder ap]
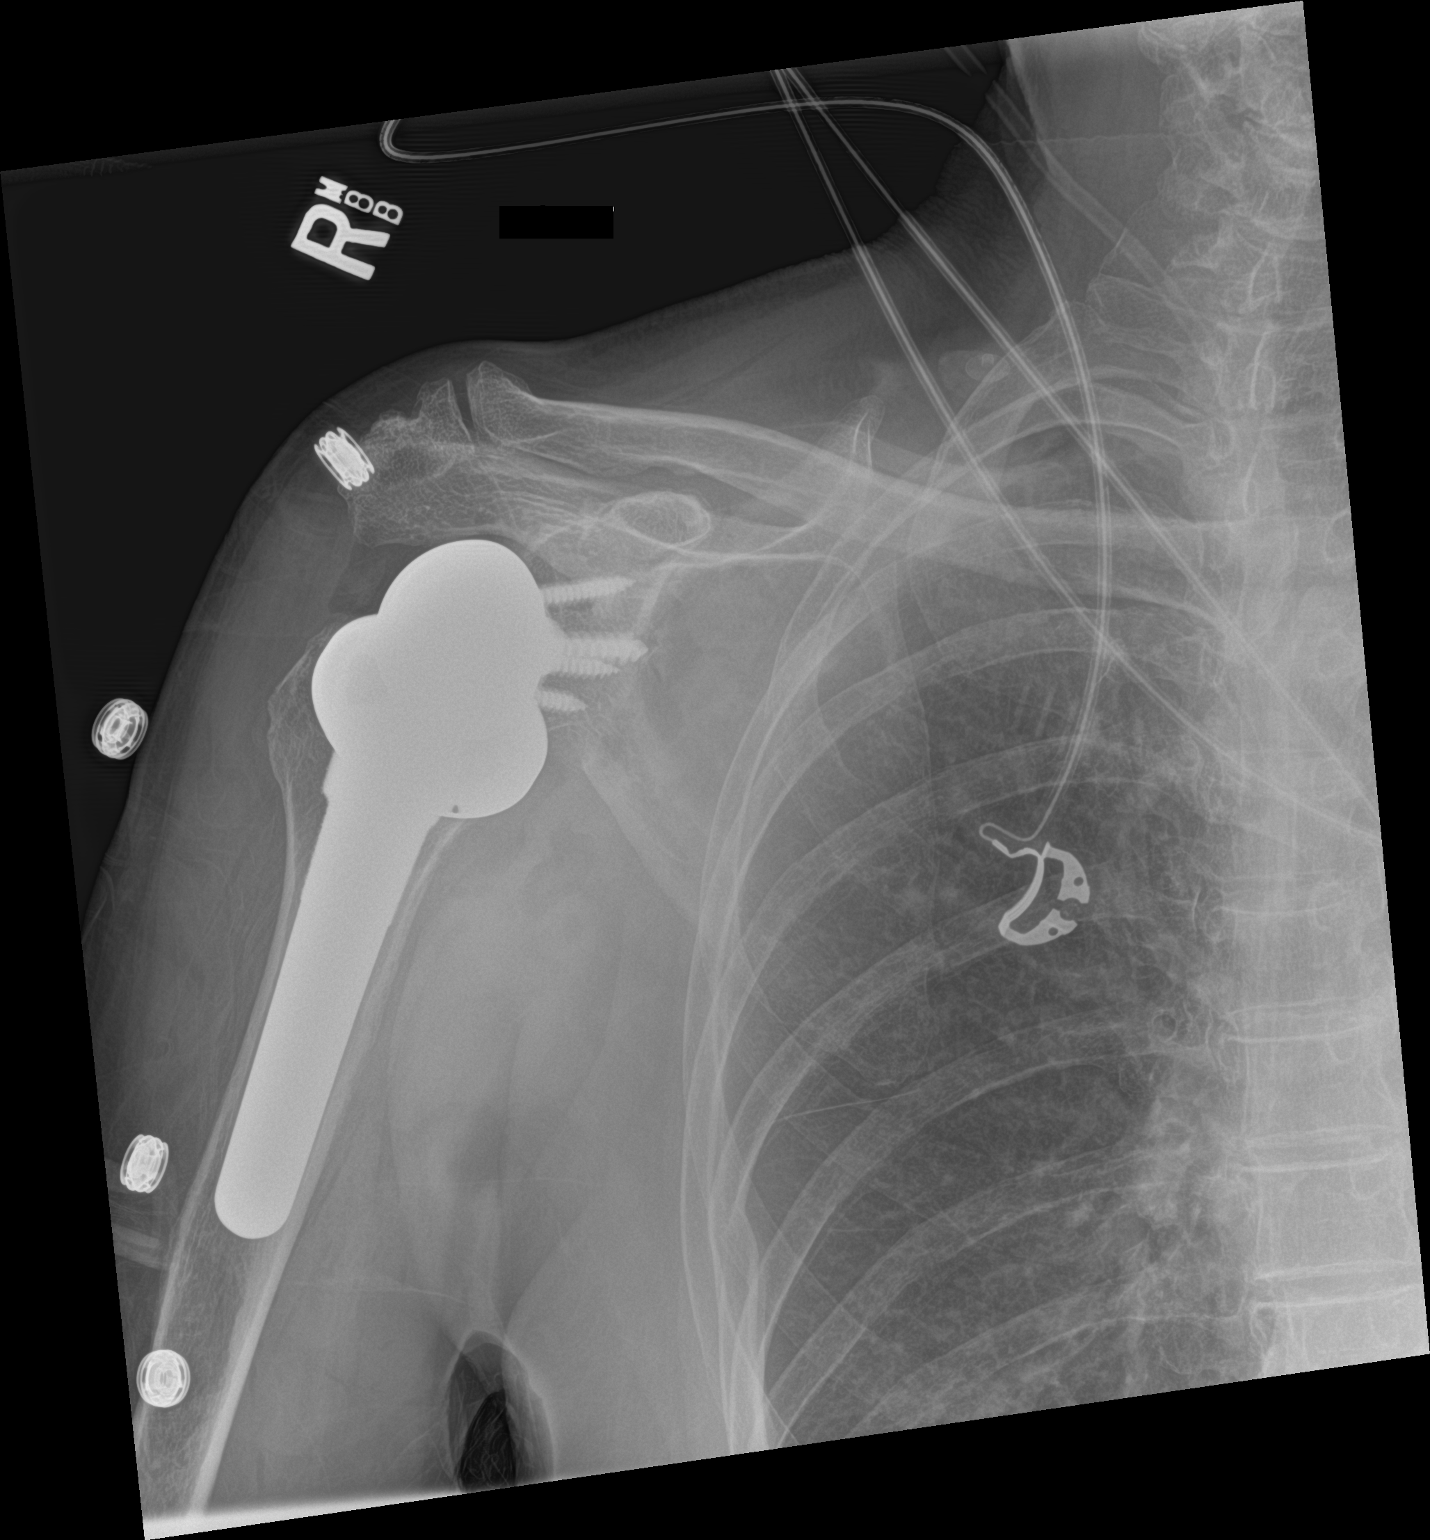

[shoulder obl]
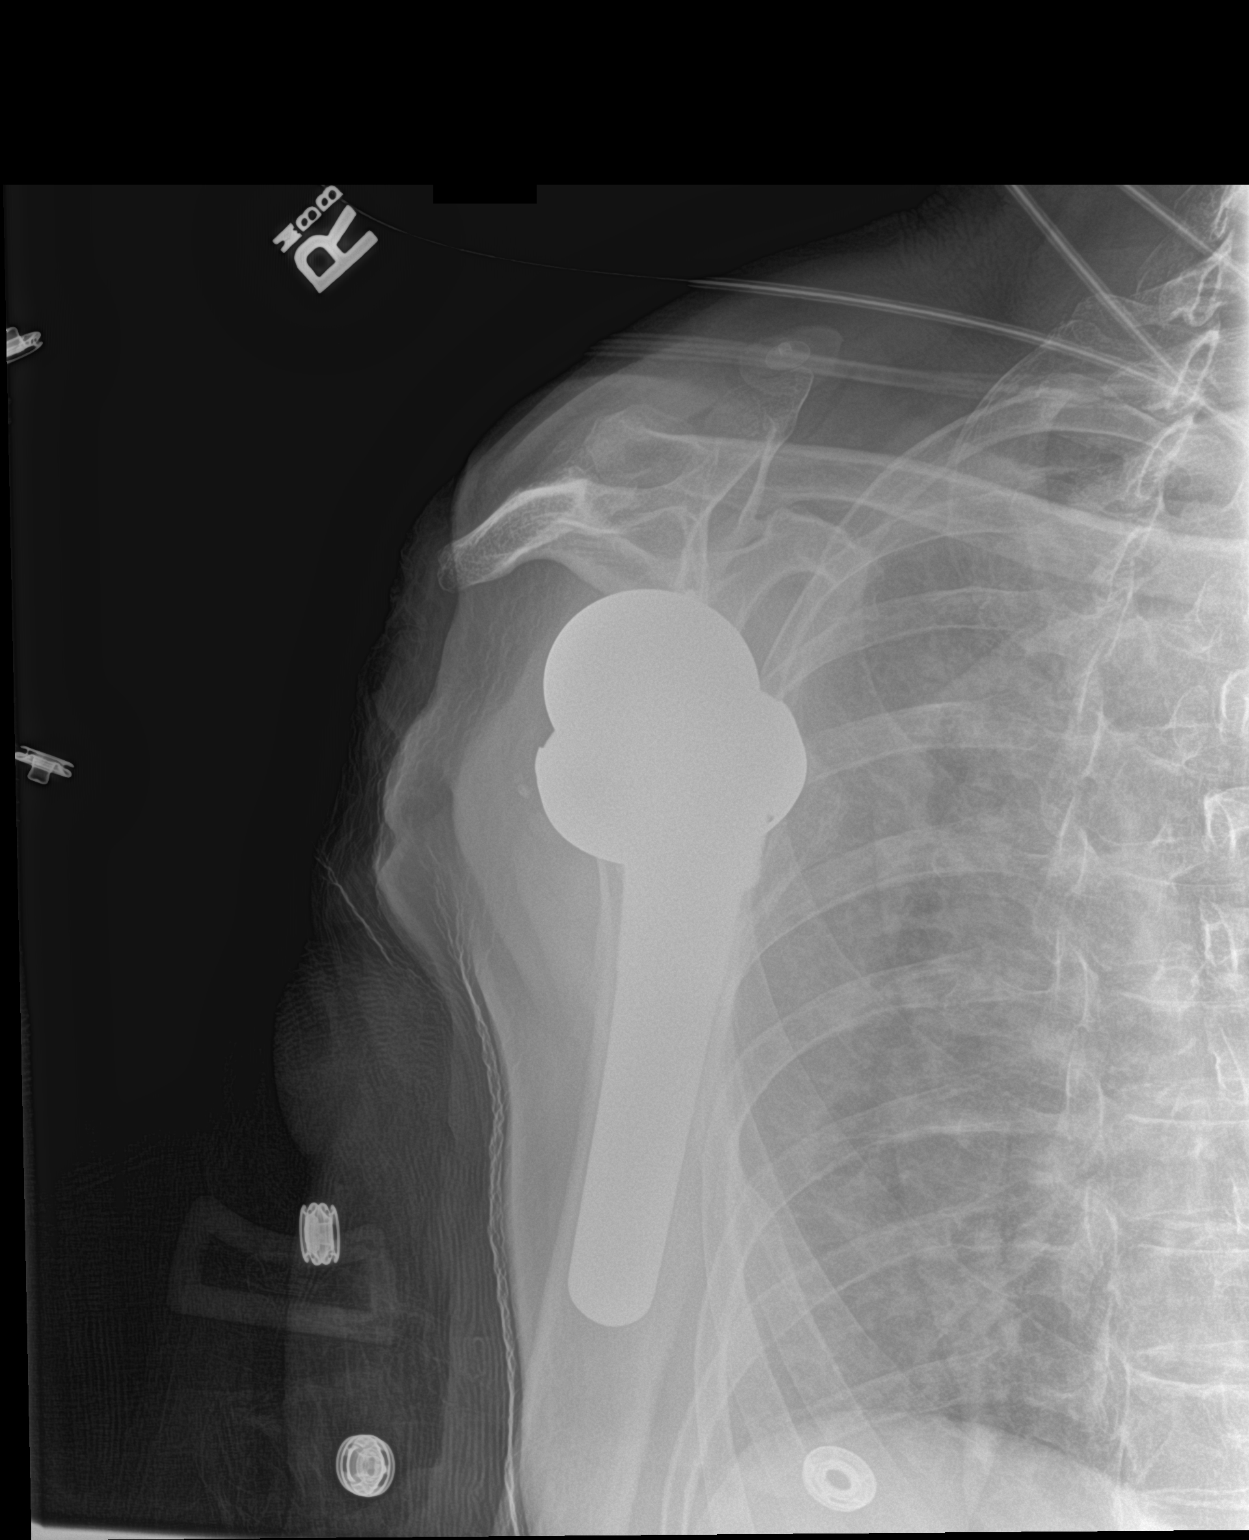

[2 of 2 positions shown; findings below may reference images not displayed]

FINDINGS: Two views are submitted, demonstrating interval reverse shoulder
arthroplasty. The hardware appears well positioned. No evidence of
acute fracture or dislocation. Moderate acromioclavicular
degenerative changes are present. There is an old right-sided rib
fracture.
IMPRESSION: No demonstrated complication following right shoulder reverse
arthroplasty.
